# Patient Record
Sex: Male | Born: 1985 | Race: White | Hispanic: No | Marital: Single | Smoking: Light tobacco smoker
Health system: Southern US, Community
[De-identification: ages and names within clinical notes are randomized; demographics above are authoritative.]

## PROBLEM LIST (undated history)

## (undated) DIAGNOSIS — T7840XA Allergy, unspecified, initial encounter: Secondary | ICD-10-CM

## (undated) HISTORY — PX: WISDOM TOOTH EXTRACTION: SHX21

## (undated) HISTORY — DX: Allergy, unspecified, initial encounter: T78.40XA

---

## 2012-08-27 ENCOUNTER — Ambulatory Visit (INDEPENDENT_AMBULATORY_CARE_PROVIDER_SITE_OTHER): Payer: BC Managed Care – PPO | Admitting: Family Medicine

## 2012-08-27 VITALS — BP 116/69 | HR 68 | Temp 98.0°F | Resp 16 | Ht 72.5 in | Wt 206.0 lb

## 2012-08-27 DIAGNOSIS — F429 Obsessive-compulsive disorder, unspecified: Secondary | ICD-10-CM

## 2012-08-27 DIAGNOSIS — R4681 Obsessive-compulsive behavior: Secondary | ICD-10-CM

## 2012-08-27 DIAGNOSIS — F411 Generalized anxiety disorder: Secondary | ICD-10-CM

## 2012-08-27 DIAGNOSIS — F419 Anxiety disorder, unspecified: Secondary | ICD-10-CM

## 2012-08-27 LAB — COMPREHENSIVE METABOLIC PANEL
Albumin: 4.5 g/dL (ref 3.5–5.2)
CO2: 28 mEq/L (ref 19–32)
Calcium: 9.4 mg/dL (ref 8.4–10.5)
Chloride: 104 mEq/L (ref 96–112)
Glucose, Bld: 90 mg/dL (ref 70–99)
Potassium: 5.1 mEq/L (ref 3.5–5.3)
Sodium: 138 mEq/L (ref 135–145)
Total Protein: 7 g/dL (ref 6.0–8.3)

## 2012-08-27 LAB — POCT CBC
Granulocyte percent: 75.9 %G (ref 37–80)
HCT, POC: 51.8 % (ref 43.5–53.7)
Hemoglobin: 16.9 g/dL (ref 14.1–18.1)
Lymph, poc: 1.6 (ref 0.6–3.4)
MCHC: 32.6 g/dL (ref 31.8–35.4)
POC Granulocyte: 6.8 (ref 2–6.9)
RBC: 5.29 M/uL (ref 4.69–6.13)

## 2012-08-27 LAB — TSH: TSH: 0.715 u[IU]/mL (ref 0.350–4.500)

## 2012-08-27 MED ORDER — ESCITALOPRAM OXALATE 10 MG PO TABS: 10.0000 mg | ORAL_TABLET | Freq: Every day | ORAL | Status: DC

## 2012-08-27 NOTE — Patient Instructions (Addendum)
1. Anxiety  POCT CBC, Comprehensive metabolic panel, TSH  2. Obsessive behavior

## 2012-08-27 NOTE — Progress Notes (Signed)
[address]   [place], [place]  [address]   [phone]  Subjective:    Patient ID: Zachary Beard, male    DOB: August 24, 1985, 27 y.o.   MRN: [id number]  HPIThis 27 y.o. male presents for evaluation of stress.  When gets stressed, will go to the gym or drink.  Does not drink daily; drinks twice weekly during slow work season.  With busy work schedule, will drink once very two weeks.  Always goes to the gym; increased frequency with stress. Previously weighed 300 pounds; very determined to manage weight.    If does something, does it in excess and takes too far.  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 Also excess worrier since childhood.    Dating for five years; finace perceives that excessive personality traits/habits are worsening.[facility] house but does not have to be perfect.  If starts to clean, must be perfect.  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  Has been exercising consistently for two months at six days per week.  Current stressors include applying to grad school.  +Excessive worrying chronic.  Create things to worry about when things are going well.  Fiance sees Merla Riches; fiance suffers with seasonal affective disorder; greatly improved with medication; thus pt sees the benefit of trial of medication.  No SI/HI.  Works at [employer]; Arts development officer.  Applying to graduate school.  No SI/HI.  No alcohol use daily; no drug use in years; no previous addiction to drugs; would like to drink daily but realizes this is not good for him so will not keep alcohol in the house.  Smokes socially; dips socially.   Has never undergone counseling or behavior therapy.  +chronic problem with difficulty falling asleep.  Review of Systems  Constitutional: Negative for fever, chills, diaphoresis, appetite change and fatigue.  Psychiatric/Behavioral: Positive for sleep  disturbance. Negative for suicidal ideas, behavioral problems, confusion, self-injury, dysphoric mood, decreased concentration and agitation. The patient is nervous/anxious. The patient is not hyperactive.         Past Medical History  Diagnosis Date  . Allergy     History reviewed. No pertinent past surgical history.  Prior to Admission medications   Medication Sig Start Date End Date Taking? Authorizing Provider  escitalopram (LEXAPRO) 10 MG tablet Take 1 tablet (10 mg total) by mouth daily. 08/27/12   Ethelda Chick, MD  Omega-3 Fatty Acids (FISH OIL PO) Take by mouth.   Yes Historical Provider, MD    No Known Allergies  History   Social History  . Marital Status: Single    Spouse Name: N/A    Number of Children: N/A  . Years of Education: N/A   Occupational History  . Not on file.   Social History Main Topics  . Smoking status: Never Smoker   . Smokeless tobacco: Not on file  . Alcohol Use: Yes  . Drug Use: No  . Sexually Active: Yes   Other Topics Concern  . Not on file   Social History Narrative   Marital status: single; engaged.  Together x 5 years.   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    Family History  Problem Relation Age of Onset  . Cancer Mother   . Hepatitis C Mother   . Hypertension Mother     Objective:   Physical Exam  Nursing note and vitals reviewed. Constitutional: He is oriented to person, place, and time. He appears well-developed and well-nourished. No distress.  HENT:  Head: Normocephalic and atraumatic.  Eyes: Conjunctivae normal are normal. Pupils are equal, round, and reactive to light.  Neck: Normal range of motion. Neck supple. No thyromegaly present.    Cardiovascular: Normal rate, regular rhythm and normal heart sounds.   No murmur heard. Pulmonary/Chest: Effort normal and breath sounds normal. He has no wheezes. He has no rales.  Lymphadenopathy:    He has no cervical adenopathy.  Neurological: He is alert and oriented to person, place, and time. He has normal reflexes. No cranial nerve deficit. He exhibits normal muscle tone. Coordination normal.  Skin: He is not diaphoretic.  Psychiatric: He has a normal mood and affect. His behavior is normal. Judgment and thought content normal.        Assessment & Plan:   1. Anxiety  POCT CBC, Comprehensive metabolic panel, TSH  2. Obsessive behavior       1. Anxiety:  New.  Treat with Lexapro 10mg  daily; also recommend counseling; pt to contact [employer] to see if services available for employees; if not, will refer for psychotherapy at next visit.  Obtain TSH.  Recommend follow-up in 4-6 weeks. 2.  Obsessive behavior:  New.  Worsening with increased stressors. Treat underlying anxiety and reassess.  Also recommend psychotherapy.  Meds ordered this encounter  Medications  . Omega-3 Fatty Acids (FISH OIL PO)    Sig: Take by mouth.  . escitalopram (LEXAPRO) 10 MG tablet    Sig: Take 1 tablet (10 mg total) by mouth daily.    Dispense:  30 tablet    Refill:  5

## 2012-08-28 NOTE — Progress Notes (Signed)
Left msg for pt to call to schedule 5 week f/up with Dr. Katrinka Blazing, Lennon Alstrom

## 2012-09-01 NOTE — Progress Notes (Signed)
No response from pt, sent reminder letter to schedule 4-6 week f-up with Dr. Katrinka Blazing.

## 2012-11-03 ENCOUNTER — Encounter (HOSPITAL_COMMUNITY): Payer: Self-pay | Admitting: Emergency Medicine

## 2012-11-03 ENCOUNTER — Emergency Department (HOSPITAL_COMMUNITY)
Admission: EM | Admit: 2012-11-03 | Discharge: 2012-11-03 | Disposition: A | Discharge status: Home / Self Care | Payer: BC Managed Care – PPO | Attending: Emergency Medicine | Admitting: Emergency Medicine

## 2012-11-03 ENCOUNTER — Emergency Department (HOSPITAL_COMMUNITY): Payer: BC Managed Care – PPO

## 2012-11-03 DIAGNOSIS — S02402A Zygomatic fracture, unspecified, initial encounter for closed fracture: Secondary | ICD-10-CM

## 2012-11-03 DIAGNOSIS — S0003XA Contusion of scalp, initial encounter: Secondary | ICD-10-CM | POA: Insufficient documentation

## 2012-11-03 DIAGNOSIS — S01501A Unspecified open wound of lip, initial encounter: Secondary | ICD-10-CM | POA: Insufficient documentation

## 2012-11-03 DIAGNOSIS — S0990XA Unspecified injury of head, initial encounter: Secondary | ICD-10-CM | POA: Insufficient documentation

## 2012-11-03 DIAGNOSIS — Z23 Encounter for immunization: Secondary | ICD-10-CM | POA: Insufficient documentation

## 2012-11-03 DIAGNOSIS — Z79899 Other long term (current) drug therapy: Secondary | ICD-10-CM | POA: Insufficient documentation

## 2012-11-03 DIAGNOSIS — S0180XA Unspecified open wound of other part of head, initial encounter: Secondary | ICD-10-CM | POA: Insufficient documentation

## 2012-11-03 DIAGNOSIS — S00431A Contusion of right ear, initial encounter: Secondary | ICD-10-CM

## 2012-11-03 DIAGNOSIS — S0083XA Contusion of other part of head, initial encounter: Secondary | ICD-10-CM

## 2012-11-03 DIAGNOSIS — T07XXXA Unspecified multiple injuries, initial encounter: Secondary | ICD-10-CM

## 2012-11-03 DIAGNOSIS — S02400A Malar fracture unspecified, initial encounter for closed fracture: Secondary | ICD-10-CM | POA: Insufficient documentation

## 2012-11-03 DIAGNOSIS — S02401A Maxillary fracture, unspecified, initial encounter for closed fracture: Secondary | ICD-10-CM | POA: Insufficient documentation

## 2012-11-03 DIAGNOSIS — F1010 Alcohol abuse, uncomplicated: Secondary | ICD-10-CM | POA: Insufficient documentation

## 2012-11-03 DIAGNOSIS — F10929 Alcohol use, unspecified with intoxication, unspecified: Secondary | ICD-10-CM

## 2012-11-03 DIAGNOSIS — F17200 Nicotine dependence, unspecified, uncomplicated: Secondary | ICD-10-CM | POA: Insufficient documentation

## 2012-11-03 LAB — POCT I-STAT, CHEM 8
Calcium, Ion: 1.06 mmol/L — ABNORMAL LOW (ref 1.12–1.23)
Chloride: 106 mEq/L (ref 96–112)
Glucose, Bld: 95 mg/dL (ref 70–99)
HCT: 48 % (ref 39.0–52.0)
TCO2: 27 mmol/L (ref 0–100)

## 2012-11-03 LAB — CBC
MCH: 33.5 pg (ref 26.0–34.0)
MCHC: 35.5 g/dL (ref 30.0–36.0)
MCV: 94.5 fL (ref 78.0–100.0)
Platelets: 172 10*3/uL (ref 150–400)
RBC: 4.89 MIL/uL (ref 4.22–5.81)
RDW: 13.1 % (ref 11.5–15.5)

## 2012-11-03 LAB — ETHANOL: Alcohol, Ethyl (B): 188 mg/dL — ABNORMAL HIGH (ref 0–11)

## 2012-11-03 MED ORDER — HYDROCODONE-ACETAMINOPHEN 5-325 MG PO TABS: 1.0000 | ORAL_TABLET | Freq: Four times a day (QID) | ORAL | Status: DC | PRN

## 2012-11-03 MED ORDER — CEPHALEXIN 500 MG PO CAPS: 500.0000 mg | ORAL_CAPSULE | Freq: Two times a day (BID) | ORAL | Status: DC

## 2012-11-03 MED ORDER — TETANUS-DIPHTH-ACELL PERTUSSIS 5-2.5-18.5 LF-MCG/0.5 IM SUSP
0.5000 mL | Freq: Once | INTRAMUSCULAR | Status: AC
  Administered 2012-11-03: 0.5 mL via INTRAMUSCULAR
  Filled 2012-11-03: qty 0.5

## 2012-11-03 NOTE — ED Notes (Signed)
Although pt GCS is 13, he is able to properly identify the name of friends here to see him in the waiting room.  Pt agreed to have them come see him.  Will allow them to see him briefly until he is able to become more coherent.
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
## 2012-11-04 NOTE — ED Provider Notes (Signed)
Medical screening examination/treatment/procedure(s) were performed by non-physician practitioner and as supervising physician I was immediately available for consultation/collaboration.   Julie Manly, MD 11/04/12 0822 

## 2018-03-14 ENCOUNTER — Emergency Department (HOSPITAL_COMMUNITY): Payer: No Typology Code available for payment source

## 2018-03-14 ENCOUNTER — Encounter (HOSPITAL_COMMUNITY): Payer: Self-pay | Admitting: Emergency Medicine

## 2018-03-14 ENCOUNTER — Emergency Department (HOSPITAL_COMMUNITY)
Admission: EM | Admit: 2018-03-14 | Discharge: 2018-03-14 | Disposition: A | Discharge status: Home / Self Care | Payer: No Typology Code available for payment source | Attending: Emergency Medicine | Admitting: Emergency Medicine

## 2018-03-14 ENCOUNTER — Other Ambulatory Visit: Payer: Self-pay

## 2018-03-14 DIAGNOSIS — R0781 Pleurodynia: Secondary | ICD-10-CM | POA: Diagnosis not present

## 2018-03-14 DIAGNOSIS — F17200 Nicotine dependence, unspecified, uncomplicated: Secondary | ICD-10-CM | POA: Diagnosis not present

## 2018-03-14 DIAGNOSIS — Z79899 Other long term (current) drug therapy: Secondary | ICD-10-CM | POA: Diagnosis not present

## 2018-03-14 LAB — I-STAT CHEM 8, ED
BUN: 19 mg/dL (ref 6–20)
CHLORIDE: 102 mmol/L (ref 98–111)
Calcium, Ion: 1.07 mmol/L — ABNORMAL LOW (ref 1.15–1.40)
Creatinine, Ser: 0.9 mg/dL (ref 0.61–1.24)
Glucose, Bld: 111 mg/dL — ABNORMAL HIGH (ref 70–99)
HEMATOCRIT: 49 % (ref 39.0–52.0)
Hemoglobin: 16.7 g/dL (ref 13.0–17.0)
Potassium: 4.9 mmol/L (ref 3.5–5.1)
SODIUM: 137 mmol/L (ref 135–145)
TCO2: 28 mmol/L (ref 22–32)

## 2018-03-14 LAB — I-STAT TROPONIN, ED: Troponin i, poc: 0 ng/mL (ref 0.00–0.08)

## 2018-03-14 MED ORDER — KETOROLAC TROMETHAMINE 15 MG/ML IJ SOLN
15.0000 mg | Freq: Once | INTRAMUSCULAR | Status: AC
  Administered 2018-03-14: 15 mg via INTRAVENOUS
  Filled 2018-03-14: qty 1

## 2018-03-14 MED ORDER — LIDOCAINE 5 % EX PTCH
1.0000 | MEDICATED_PATCH | CUTANEOUS | Status: DC
  Administered 2018-03-14: 1 via TRANSDERMAL
  Filled 2018-03-14: qty 1

## 2018-03-14 MED ORDER — LIDOCAINE 5 % EX PTCH: 1.0000 | MEDICATED_PATCH | CUTANEOUS | 0 refills | Status: DC

## 2018-03-14 MED ORDER — CYCLOBENZAPRINE HCL 10 MG PO TABS: 10.0000 mg | ORAL_TABLET | Freq: Two times a day (BID) | ORAL | 0 refills | Status: DC | PRN

## 2018-03-14 NOTE — ED Provider Notes (Signed)
[facility] EMERGENCY DEPARTMENT Provider Note   CSN: [id number] Arrival date & time: 03/14/18  0112     History   Chief Complaint Chief Complaint  Patient presents with  . Chest Pain    HPI Zachary Beard is a 32 y.o. male.  HPI 32 year old Caucasian male presents to the ED for evaluation of left-sided rib cage pain.  Patient reports 10 years ago he "dislocated his ribs".  Patient states he did this while warrant running.  He was seen by the orthopedic doctor.  He states that he will get these episodes where he gets intense pain in the left side of his ribs when he is working out.  He states that they typically resolve by anti-inflammatories and rest.  States that this evening he had the pain and awoke him from sleep.  He took ibuprofen without any relief.  Patient states pain is worse with palpation and range of motion.  Denies any associated shortness of breath or chest pain.  Patient denies any history of DVT/PE, prolonged immobilization or recent hospitalization/surgeries, unilateral leg swelling or calf tenderness, hemoptysis or tobacco use.  Denies any cardiac history.  Patient denies any recent injuries to his chest.  Denies any nausea or vomiting.  Pt denies any fever, chill, ha, vision changes, lightheadedness, dizziness, congestion, neck pain, cp, sob, cough, abd pain, n/v/d, urinary symptoms, change in bowel habits, melena, hematochezia, lower extremity paresthesias.  Past Medical History:  Diagnosis Date  . Allergy     There are no active problems to display for this patient.   Past Surgical History:  Procedure Laterality Date  . WISDOM TOOTH EXTRACTION          Home Medications    Prior to Admission medications   Medication Sig Start Date End Date Taking? Authorizing Provider  cephALEXin (KEFLEX) 500 MG capsule Take 1 capsule (500 mg total) by mouth 2 (two) times daily. 11/03/12   Kirichenko, Lemont Fillers, PA-C  cyclobenzaprine (FLEXERIL) 10 MG  tablet Take 1 tablet (10 mg total) by mouth 2 (two) times daily as needed for muscle spasms. 03/14/18   Rise Mu, PA-C  escitalopram (LEXAPRO) 10 MG tablet Take 1 tablet (10 mg total) by mouth daily. 08/27/12   Ethelda Chick, MD  HYDROcodone-acetaminophen (NORCO) 5-325 MG per tablet Take 1 tablet by mouth every 6 (six) hours as needed for pain. 11/03/12   Kirichenko, Tatyana, PA-C  lidocaine (LIDODERM) 5 % Place 1 patch onto the skin daily. Remove & Discard patch within 12 hours or as directed by MD 03/14/18   Demetrios Loll T, PA-C  Omega-3 Fatty Acids (FISH OIL PO) Take by mouth.    [provider]    Family History Family History  Problem Relation Age of Onset  . Cancer Mother   . Hepatitis C Mother   . Hypertension Mother     Social History Social History   Tobacco Use  . Smoking status: Light Tobacco Smoker  . Smokeless tobacco: Never Used  Substance Use Topics  . Alcohol use: Yes  . Drug use: No     Allergies   Patient has no known allergies.   Review of Systems Review of Systems  All other systems reviewed and are negative.    Physical Exam Updated Vital Signs BP 128/71 (BP Location: Right Arm)   Pulse 69   Temp 98.1 F (36.7 C) (Oral)   Resp 16   Ht 6' (1.829 m)   Wt 97.5 kg (215 lb)  SpO2 99%   BMI 29.16 kg/m   Physical Exam  Constitutional: He appears well-developed and well-nourished. No distress.  HENT:  Head: Normocephalic and atraumatic.  Eyes: Right eye exhibits no discharge. Left eye exhibits no discharge. No scleral icterus.  Neck: Normal range of motion.  Cardiovascular: Normal rate, regular rhythm, normal heart sounds and intact distal pulses. Exam reveals no gallop and no friction rub.  No murmur heard. Pulmonary/Chest: Effort normal. No respiratory distress. He has no decreased breath sounds. He has no wheezes. He has no rales.      Tender to palpation.  No obvious ecchymosis, crepitus, step-offs.  Abdominal:  Soft. Bowel sounds are normal.  Musculoskeletal: Normal range of motion.  No lower extremity edema. No calf tenderness.   Neurological: He is alert.  Skin: Skin is warm and dry. Capillary refill takes less than 2 seconds. No pallor.  Psychiatric: His behavior is normal. Judgment and thought content normal.  Nursing note and vitals reviewed.    ED Treatments / Results  Labs (all labs ordered are listed, but only abnormal results are displayed) Labs Reviewed  I-STAT CHEM 8, ED - Abnormal; Notable for the following components:      Result Value   Glucose, Bld 111 (*)    Calcium, Ion 1.07 (*)    All other components within normal limits  I-STAT TROPONIN, ED    EKG EKG Interpretation  Date/Time:  Tuesday March 14 2018 05:17:31 EDT Ventricular Rate:  77 PR Interval:  102 QRS Duration: 100 QT Interval:  386 QTC Calculation: 436 R Axis:   91 Text Interpretation:  Sinus rhythm with short PR Rightward axis Borderline ECG Confirmed by Geoffery Lyons ([id number]) on 03/14/2018 5:25:48 AM Also confirmed by Geoffery Lyons ([id number]), editor Barbette Hair [phone])  on 03/14/2018 7:38:51 AM   Radiology Dg Chest 2 View  Result Date: 03/14/2018 CLINICAL DATA:  Rib injury left-sided rib pain EXAM: CHEST - 2 VIEW COMPARISON:  11/03/2012 FINDINGS: No acute opacity or pleural effusion. Normal heart size. No pneumothorax. Right-sided nipple ring. No acute osseous abnormality. IMPRESSION: No active cardiopulmonary disease. Electronically Signed   By: Jasmine Pang M.D.   On: 03/14/2018 02:09    Procedures Procedures (including critical care time)  Medications Ordered in ED Medications  ketorolac (TORADOL) 15 MG/ML injection 15 mg (15 mg Intravenous Given 03/14/18 0550)     Initial Impression / Assessment and Plan / ED Course  I have reviewed the triage vital signs and the nursing notes.  Pertinent labs & imaging results that were available during my care of the patient were reviewed by me and  considered in my medical decision making (see chart for details).     Patient reports to the ED for intermittent left rib cage pain after dislocating her rib 10 years ago.  Patient states the pain was not controlled by ibuprofen at home this evening.  He is PERC negative.  Denies cardiac history.  Work-up reassuring.  EKG shows no signs of acute ischemia.  Troponin was negative.  Chest x-ray shows no acute findings.  Pain is reproducible on palpation.  Improved with Toradol and lidocaine patch.  Suspect musculoskeletal in nature.  Clinical presentation not consistent with ACS, dissection or PE.  Patient will be treated with anti-inflammatories, muscle relaxers and lidocaine patch.  Encouraged follow-up with PCP and return precautions.  Pt is hemodynamically stable, in NAD, & able to ambulate in the ED. Evaluation does not show pathology that would require ongoing emergent intervention or  inpatient treatment. I explained the diagnosis to the patient. Pain has been managed & has no complaints prior to dc. Pt is comfortable with above plan and is stable for discharge at this time. All questions were answered prior to disposition. Strict return precautions for f/u to the ED were discussed. Encouraged follow up with PCP.   Final Clinical Impressions(s) / ED Diagnoses   Final diagnoses:  Rib pain    ED Discharge Orders        Ordered    lidocaine (LIDODERM) 5 %  Every 24 hours     03/14/18 0541    cyclobenzaprine (FLEXERIL) 10 MG tablet  2 times daily PRN     03/14/18 0541       Rise MuLeaphart,  T, PA-C 03/14/18 2155    Geoffery Lyonselo, Douglas, MD 03/15/18 [phone]

## 2018-03-14 NOTE — Discharge Instructions (Signed)
Your work-up has been reassuring.  This may be musculoskeletal in nature.  Continue taking NSAIDs.Please the the flexeril for muscle relaxation. This medication will make you drowsy so avoid situation that could place you in danger.  Follow-up with your primary care doctor return the ED with any worsening symptoms.  May keep ice on the area to help with the pain.  Have given you lidocaine patches to help as well.

## 2018-03-14 NOTE — ED Triage Notes (Signed)
Pt states he has history 10 years ago of rib "dislocation" and resolved with rest and anti inflammatories.  Has happened again over that time but self resolves.    This morning awoke with rib pain on the left side.

## 2018-11-28 ENCOUNTER — Emergency Department (HOSPITAL_COMMUNITY): Payer: No Typology Code available for payment source

## 2018-11-28 ENCOUNTER — Other Ambulatory Visit: Payer: Self-pay

## 2018-11-28 ENCOUNTER — Emergency Department (HOSPITAL_COMMUNITY)
Admission: EM | Admit: 2018-11-28 | Discharge: 2018-11-28 | Disposition: A | Discharge status: Home / Self Care | Payer: No Typology Code available for payment source | Attending: Emergency Medicine | Admitting: Emergency Medicine

## 2018-11-28 ENCOUNTER — Encounter (HOSPITAL_COMMUNITY): Payer: Self-pay | Admitting: Emergency Medicine

## 2018-11-28 DIAGNOSIS — Y929 Unspecified place or not applicable: Secondary | ICD-10-CM | POA: Diagnosis not present

## 2018-11-28 DIAGNOSIS — F17210 Nicotine dependence, cigarettes, uncomplicated: Secondary | ICD-10-CM | POA: Insufficient documentation

## 2018-11-28 DIAGNOSIS — S51852A Open bite of left forearm, initial encounter: Secondary | ICD-10-CM | POA: Insufficient documentation

## 2018-11-28 DIAGNOSIS — Y939 Activity, unspecified: Secondary | ICD-10-CM | POA: Insufficient documentation

## 2018-11-28 DIAGNOSIS — S61451A Open bite of right hand, initial encounter: Secondary | ICD-10-CM | POA: Diagnosis not present

## 2018-11-28 DIAGNOSIS — Y999 Unspecified external cause status: Secondary | ICD-10-CM | POA: Insufficient documentation

## 2018-11-28 DIAGNOSIS — W540XXA Bitten by dog, initial encounter: Secondary | ICD-10-CM | POA: Diagnosis not present

## 2018-11-28 DIAGNOSIS — S51851A Open bite of right forearm, initial encounter: Secondary | ICD-10-CM | POA: Diagnosis not present

## 2018-11-28 DIAGNOSIS — S41151A Open bite of right upper arm, initial encounter: Secondary | ICD-10-CM

## 2018-11-28 DIAGNOSIS — S61452A Open bite of left hand, initial encounter: Secondary | ICD-10-CM | POA: Diagnosis present

## 2018-11-28 DIAGNOSIS — S41152A Open bite of left upper arm, initial encounter: Secondary | ICD-10-CM

## 2018-11-28 DIAGNOSIS — Z23 Encounter for immunization: Secondary | ICD-10-CM | POA: Insufficient documentation

## 2018-11-28 MED ORDER — KETOROLAC TROMETHAMINE 30 MG/ML IJ SOLN
30.0000 mg | Freq: Once | INTRAMUSCULAR | Status: AC
  Administered 2018-11-28: 06:00:00 30 mg via INTRAMUSCULAR
  Filled 2018-11-28: qty 1

## 2018-11-28 MED ORDER — TETANUS-DIPHTH-ACELL PERTUSSIS 5-2.5-18.5 LF-MCG/0.5 IM SUSP
0.5000 mL | Freq: Once | INTRAMUSCULAR | Status: AC
  Administered 2018-11-28: 05:00:00 0.5 mL via INTRAMUSCULAR
  Filled 2018-11-28: qty 0.5

## 2018-11-28 MED ORDER — AMOXICILLIN-POT CLAVULANATE 875-125 MG PO TABS: 1.0000 | ORAL_TABLET | Freq: Two times a day (BID) | ORAL | 0 refills | Status: DC

## 2018-11-28 MED ORDER — AMPICILLIN-SULBACTAM SODIUM 3 (2-1) G IJ SOLR
3.0000 g | Freq: Once | INTRAMUSCULAR | Status: AC
  Administered 2018-11-28: 05:00:00 3 g via INTRAVENOUS
  Filled 2018-11-28: qty 3

## 2018-11-28 MED ORDER — CLINDAMYCIN PHOSPHATE 600 MG/50ML IV SOLN
600.0000 mg | Freq: Once | INTRAVENOUS | Status: AC
  Administered 2018-11-28: 600 mg via INTRAVENOUS
  Filled 2018-11-28: qty 50

## 2018-11-28 MED ORDER — LIDOCAINE HCL 1 % IJ SOLN
20.0000 mL | Freq: Once | INTRAMUSCULAR | Status: DC
  Filled 2018-11-28: qty 20

## 2018-11-28 NOTE — Discharge Instructions (Addendum)
Thank you for allowing me to care for you today in the Emergency Department.   Call today to schedule a wound check with Dr. Izora Ribas in his office in 2 to 3 days.  Take 1 tablet of Augmentin 2 times daily for the next week.  You can take Tylenol and ibuprofen for pain control.  Please wear the brace on your left hand to avoid ripping out your stitches.   To care for your other wounds, clean the areas with warm water and soap at least once daily.  You can then apply a topical antibiotic such as bacitracin or Neosporin to the wounds.  For the wound on your right ring finger, after cleaning the wound, replace the yellow Xeroform dressing at least once daily and then reapply a gauze dressing and brown Coban as we instructed you in the ER.  Change this daily until you are seen by Dr. Izora Ribas.  Dr. Izora Ribas recommended that sometimes when these wounds scab over that they will have a little bit of clear/yellowish drainage under the scab. Allowing the wound to drain can help with pain.   You can apply an ice pack to help with bruising and swelling.  Your tetanus immunization was updated today.  Follow-up with animal control regarding the dog's rabies status since you did not receive the rabies vaccination series today.  Return to the emergency department if you develop a high fever, red streaking up the wounds, if the wounds get red, hot to the touch, have worsening thick, mucus-like drainage with a foul odor despite taking the antibiotics, or other new, concerning symptoms.

## 2018-11-28 NOTE — ED Provider Notes (Signed)
[facility] EMERGENCY DEPARTMENT Provider Note   CSN: [id number] Arrival date & time: 11/28/18  0324    History   Chief Complaint Chief Complaint  Patient presents with   Animal Bite    HPI Zachary Beard is a 33 y.o. male with no pertinent past medical history who presents to the emergency department with a chief complaint of dog bite.  The patient reports he was bitten multiple times on the bilateral hands and arms by a dog that he was unfamiliar with.  He reports significant pain to the aspect of the left hand and the right ring finger.  Pain is worse with movement of these areas.  He reports that the wounds continue to bleed until they were dressed in triage.  He is right-hand dominant.  He denies numbness or weakness or pain to the bilateral wrists or elbows.  He is unsure when his tetanus was last updated.  He reports that he is already spoken with animal control who has obtained the dog.  He reports that he is previously undergone the rabies immunization series several years ago as he has worked with foster dogs in the past.  He also reports drinking 1-2 beers hourly for at least the last 12 hours.  He also reports that he smoked marijuana.  He treated his wounds by pouring peroxide on them prior to arrival.  No other treatment prior to arrival.     The history is provided by the patient. No language interpreter was used.    Past Medical History:  Diagnosis Date   Allergy     There are no active problems to display for this patient.   Past Surgical History:  Procedure Laterality Date   WISDOM TOOTH EXTRACTION          Home Medications    Prior to Admission medications   Medication Sig Start Date End Date Taking? Authorizing Provider  amoxicillin-clavulanate (AUGMENTIN) 875-125 MG tablet Take 1 tablet by mouth every 12 (twelve) hours. 11/28/18   Jeri Jeanbaptiste, Coral Else, PA-C    Family History Family History  Problem Relation Age of Onset   Cancer  Mother    Hepatitis C Mother    Hypertension Mother     Social History Social History   Tobacco Use   Smoking status: Light Tobacco Smoker   Smokeless tobacco: Never Used  Substance Use Topics   Alcohol use: Yes   Drug use: No     Allergies   Patient has no known allergies.   Review of Systems Review of Systems  Constitutional: Negative for appetite change and fever.  Respiratory: Negative for shortness of breath.   Cardiovascular: Negative for chest pain.  Gastrointestinal: Negative for abdominal pain.  Genitourinary: Negative for dysuria.  Musculoskeletal: Positive for arthralgias, joint swelling and myalgias. Negative for back pain.  Skin: Positive for color change and wound. Negative for rash.  Allergic/Immunologic: Negative for immunocompromised state.  Neurological: Negative for weakness, numbness and headaches.  Psychiatric/Behavioral: Negative for confusion.     Physical Exam Updated Vital Signs BP 122/73 (BP Location: Right Arm)    Pulse 99    Resp 16    Ht 6' (1.829 m)    Wt 97.5 kg    SpO2 95%    BMI 29.16 kg/m   Physical Exam Vitals signs and nursing note reviewed.  Constitutional:      Appearance: He is well-developed.     Comments: Strong odor of EtOH and marijuana  HENT:  Head: Normocephalic.  Eyes:     Conjunctiva/sclera: Conjunctivae normal.     Comments: Eyes are glassy  Neck:     Musculoskeletal: Neck supple.  Cardiovascular:     Rate and Rhythm: Normal rate and regular rhythm.     Heart sounds: No murmur.  Pulmonary:     Effort: Pulmonary effort is normal.  Abdominal:     General: There is no distension.     Palpations: Abdomen is soft.  Skin:    General: Skin is warm and dry.     Comments:  Deep laceration noted to the ulnar aspect of the left hand.  A portion of the tendon appears intact at the base of the wound.  Bleeding is controlled with pressure.  There appear to be several dog hairs in and around the wound.  There is  a laceration located in the interdigital space between the fourth and fifth fingers on the left hand.  No active bleeding.  Wound appears more superficial and clean.  There is a laceration located on the right ring finger that extends circumferentially around the digit from the mid portion of the medial nailbed around the palmar aspect of the digit.  Nail remains intact.  There is significant edema noted to distal tip of the right fourth digit.  Wound is jagged.  There is much matted dog hair around the wound.  Multiple superficial abrasions and puncture wounds noted to the bilateral hands and forearms that are hemostatic.  Neurological:     Mental Status: He is alert.     Comments: Slurred speech  Psychiatric:        Behavior: Behavior normal.   Right fourth digit     Dorsal aspect of right forearm  Ventral aspect of right forearm  Right fourth digit  Right fourth digit  Left hand   Left hand   Ulnar aspect of left hand        ED Treatments / Results  Labs (all labs ordered are listed, but only abnormal results are displayed) Labs Reviewed - No data to display  EKG None  Radiology Dg Wrist Complete Right  Result Date: 11/28/2018 CLINICAL DATA:  Multiple dog bites EXAM: RIGHT WRIST - COMPLETE 3+ VIEW COMPARISON:  None. FINDINGS: Soft tissue swelling about the wrist, with gas and greatest swelling seen ventrally and radially. No opaque foreign body or fracture. IMPRESSION: Soft tissue injury without fracture or opaque foreign body. Electronically Signed   By: Marnee Spring M.D.   On: 11/28/2018 05:02   Dg Hand Complete Left  Result Date: 11/28/2018 CLINICAL DATA:  Dog bites to both hands EXAM: LEFT HAND - COMPLETE 3+ VIEW COMPARISON:  None. FINDINGS: Soft tissue gas at the level of the central MCP joints tracking proximally along the dorsal hand. No underlying fracture or opaque foreign body. IMPRESSION: Soft tissue injury without opaque foreign body or fracture.  Electronically Signed   By: Marnee Spring M.D.   On: 11/28/2018 05:02   Dg Hand Complete Right  Result Date: 11/28/2018 CLINICAL DATA:  Multiple dog bites EXAM: RIGHT HAND - COMPLETE 3+ VIEW COMPARISON:  None. FINDINGS: Mild soft tissue swelling at the level of the wrist. There may also be soft tissue injury to the distal ring finger. No fracture or opaque foreign body. IMPRESSION: No opaque foreign body or fracture. Electronically Signed   By: Marnee Spring M.D.   On: 11/28/2018 05:01    Procedures .Marland KitchenLaceration Repair Date/Time: 11/28/2018 9:07 AM Performed by: Frederik Pear  A, PA-C Authorized by: Barkley BoardsMcDonald, Colie Fugitt A, PA-C   Consent:    Consent obtained:  Verbal   Consent given by:  Patient   Risks discussed:  Infection, pain, poor cosmetic result, poor wound healing, nerve damage, need for additional repair, retained foreign body and tendon damage   Alternatives discussed:  No treatment and referral Anesthesia (see MAR for exact dosages):    Anesthesia method:  Local infiltration   Local anesthetic:  Lidocaine 1% w/o epi Laceration details:    Location:  Hand   Hand location: Ulnar aspect of the left hand.   Length (cm):  3.5 Repair type:    Repair type:  Intermediate Pre-procedure details:    Preparation:  Patient was prepped and draped in usual sterile fashion and imaging obtained to evaluate for foreign bodies Exploration:    Hemostasis achieved with:  Direct pressure   Wound exploration: wound explored through full range of motion and entire depth of wound probed and visualized     Wound extent: fascia violated and foreign bodies/material     Wound extent: no nerve damage noted, no tendon damage noted, no underlying fracture noted and no vascular damage noted     Foreign bodies/material:  Dog hair   Contaminated: yes   Treatment:    Area cleansed with:  Shur-Clens, Betadine and saline   Amount of cleaning:  Extensive   Visualized foreign bodies/material removed: yes   Skin  repair:    Repair method:  Sutures   Suture size:  4-0   Suture material:  Prolene   Suture technique:  Simple interrupted   Number of sutures:  4 Approximation:    Approximation:  Loose Post-procedure details:    Dressing:  Antibiotic ointment and sterile dressing   Patient tolerance of procedure:  Tolerated well, no immediate complications .Nerve Block Date/Time: 11/28/2018 9:14 AM Performed by: Barkley BoardsMcDonald, Averleigh Savary A, PA-C Authorized by: Barkley BoardsMcDonald, Erick Oxendine A, PA-C   Consent:    Consent obtained:  Verbal   Consent given by:  Patient   Risks discussed:  Swelling, unsuccessful block, pain, nerve damage, intravenous injection, infection and bleeding   Alternatives discussed:  No treatment Indications:    Indications:  Pain relief and procedural anesthesia Location:    Laterality:  Right Pre-procedure details:    Skin preparation:  Alcohol   Preparation: Patient was prepped and draped in usual sterile fashion   Skin anesthesia (see MAR for exact dosages):    Skin anesthesia method:  Local infiltration   Local anesthetic:  Lidocaine 1% w/o epi Procedure details (see MAR for exact dosages):    Block needle gauge:  27 G   Anesthetic injected:  Lidocaine 1% w/o epi   Injection procedure:  Anatomic landmarks identified, incremental injection, negative aspiration for blood, introduced needle and anatomic landmarks palpated Post-procedure details:    Dressing:  Sterile dressing   Outcome:  Anesthesia achieved   Patient tolerance of procedure:  Tolerated well, no immediate complications   (including critical care time)  Medications Ordered in ED Medications  lidocaine (XYLOCAINE) 1 % (with pres) injection 20 mL (has no administration in time range)  Tdap (BOOSTRIX) injection 0.5 mL (0.5 mLs Intramuscular Given 11/28/18 0435)  Ampicillin-Sulbactam (UNASYN) 3 g in sodium chloride 0.9 % 100 mL IVPB (0 g Intravenous Stopped 11/28/18 0509)  clindamycin (CLEOCIN) IVPB 600 mg (0 mg Intravenous Stopped  11/28/18 0752)  ketorolac (TORADOL) 30 MG/ML injection 30 mg (30 mg Intramuscular Given 11/28/18 0619)     Initial Impression /  Assessment and Plan / ED Course  I have reviewed the triage vital signs and the nursing notes.  Pertinent labs & imaging results that were available during my care of the patient were reviewed by me and considered in my medical decision making (see chart for details).        33 year old male with no pertinent past medical history who is right-hand dominant presents with multiple wounds after being bitten by an unknown dog earlier tonight.  On arrival, the patient appears intoxicated.  He reports having drank many beers over the last 12+ hours.  The patient has multiple puncture wounds and superficial abrasions noted to the bilateral hands and forearms.  Specifically, he has a large deep wound to the ulnar aspect of the left hand and a circumferential wound noted to the right ring finger.  Both wounds have subcutaneous exposed tissue.  Tdap updated.  Patient declined pain control at this time.  X-rays of the bilateral hands are negative for underlying fracture.  Consulted hand surgery and spoke with Dr. Izora Ribas regarding the extent of the patient's wounds.  He recommended loose closure in the ER and 1 dose of I V antibiotics.  The patient had already received 1 dose of Unasyn and clindamycin ordered by me.  Recommended follow-up in his office.  The wound on the ulnar aspect of the left hand was copiously irrigated and extensively cleaned.  The wound was loosely closed with 4 simple interrupted sutures.  Attempted splint placement of the left hand, but premade splint appear to rub on the wound.  I did not want to have a fiberglass splint placed because I was concerned that he would not notice signs of infection to the other puncture wounds and superficial abrasions on the hands and forearms.  I recheck to the patient's ability to move the fingers and wrist of the left hand and his  stitches appear to be intact with low risk of ripping out.  Digital block was performed to the ring finger of the right hand.  Closure with sutures was attempted, but given the circumferential nature of the wound and significant edema of the fingertip, sutures placed in this wound would not hold.  After multiple attempts, the case was discussed with Dr. Clayborne Dana, attending physician.  He recommended covering the wound with Xeroform and a gauze dressing and Coban until the patient can follow up in Dr. Debby Bud office. Dressing place.  The patient requested pain medicine and was given Toradol.  The remainder of the puncture wounds and abrasions were copiously irrigated and cleaned with a Betadine and sterile saline solution.  Recommended Tylenol, ibuprofen, and ice for pain control at home.  The patient will follow up with animal control regarding the rabies status of the dog that is currently under quarantine.  The patient is also now clinically sober.  Strict return precautions given.  The patient is hemodynamically stable and in no acute distress.  Safe for discharge home with outpatient follow-up this time.  Final Clinical Impressions(s) / ED Diagnoses   Final diagnoses:  Dog bite, hand, left, initial encounter  Dog bite of right hand, initial encounter  Dog bite of arm, left, initial encounter  Dog bite of right upper extremity, initial encounter    ED Discharge Orders         Ordered    amoxicillin-clavulanate (AUGMENTIN) 875-125 MG tablet  Every 12 hours     11/28/18 0715           Javanni Maring, Pedro Earls  A, PA-C 11/28/18 0919    Mesner, Barbara Cower, MD 11/28/18 1610

## 2018-11-28 NOTE — ED Notes (Signed)
ETOH on board 

## 2018-11-28 NOTE — ED Triage Notes (Signed)
Pt coming in after being bit by an unknown dog in multiple places after calling it to him. Pt bleeding from bilateral forearms. Last tetanus unknown

## 2019-07-25 IMAGING — CR RIGHT WRIST - COMPLETE 3+ VIEW
4 series · 4 of 4 positions shown · non-contrast
Comparison: None.

CLINICAL DATA: Multiple dog bites

EXAM:
RIGHT WRIST - COMPLETE 3+ VIEW

[wrist pa]
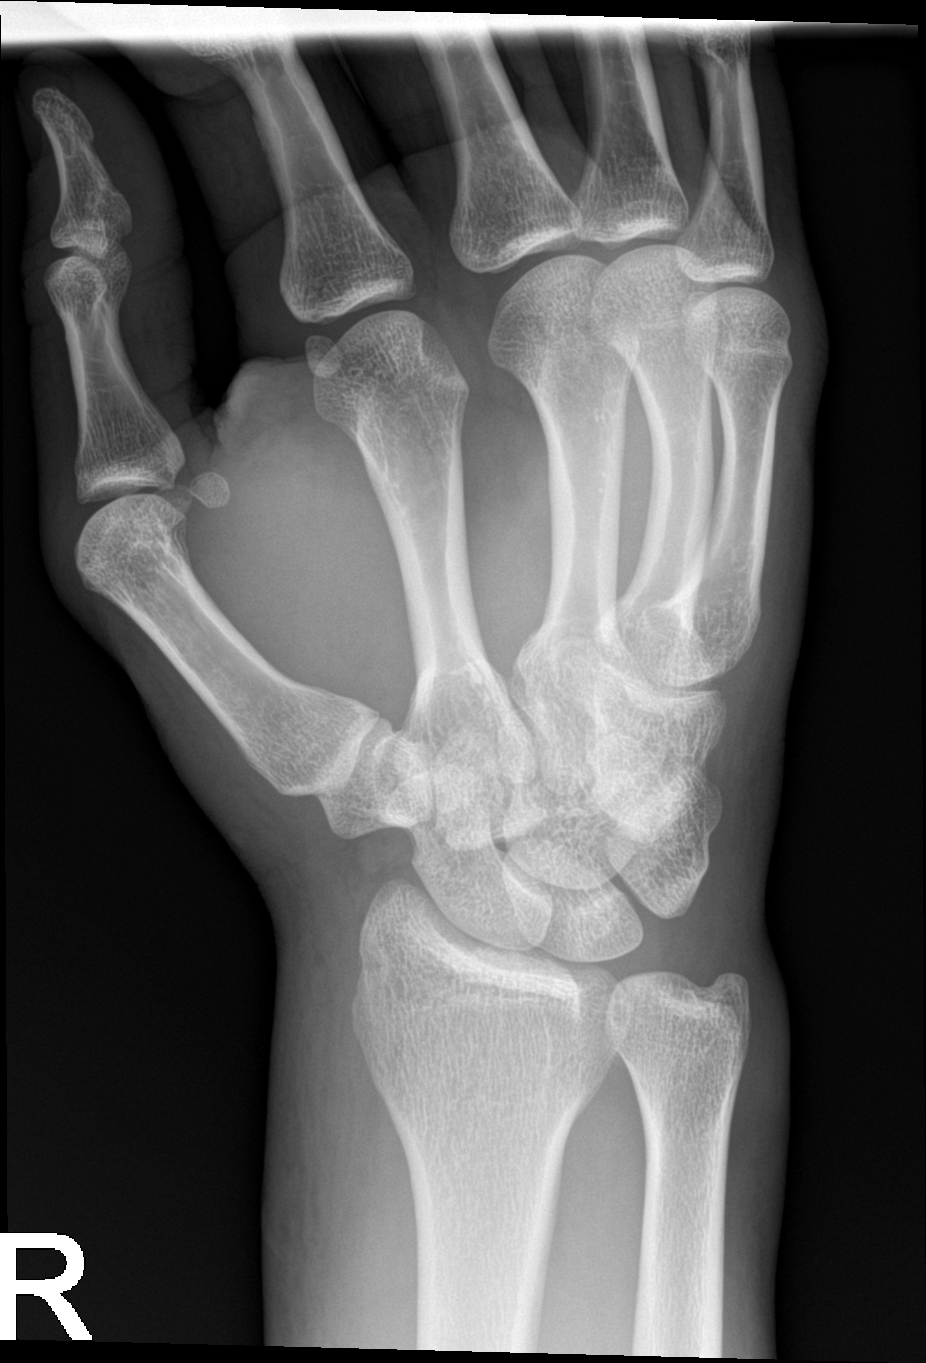

[wrist obl]
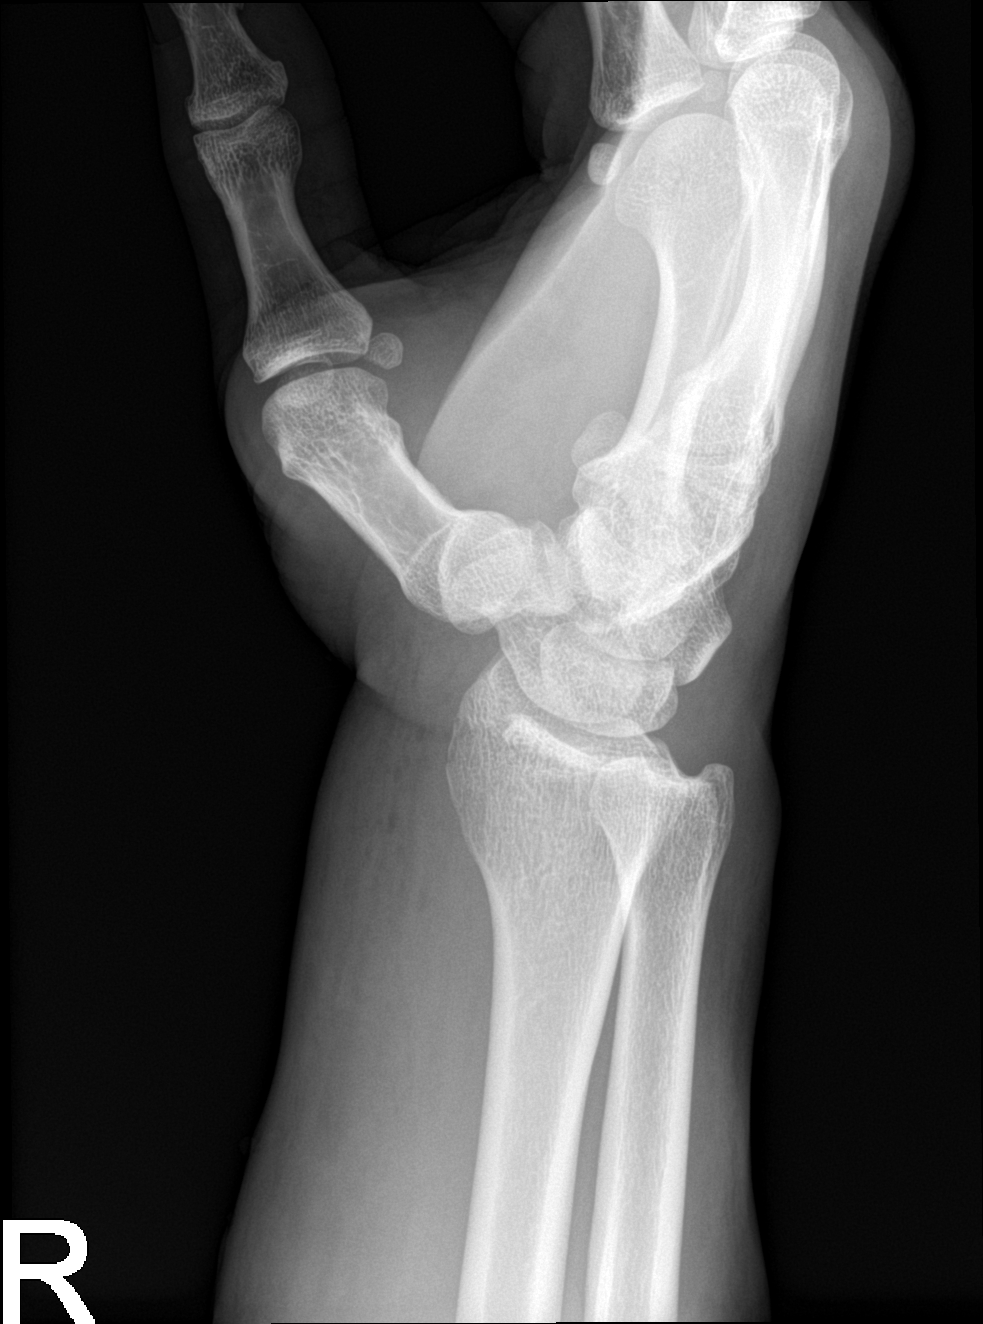

[wrist lat]
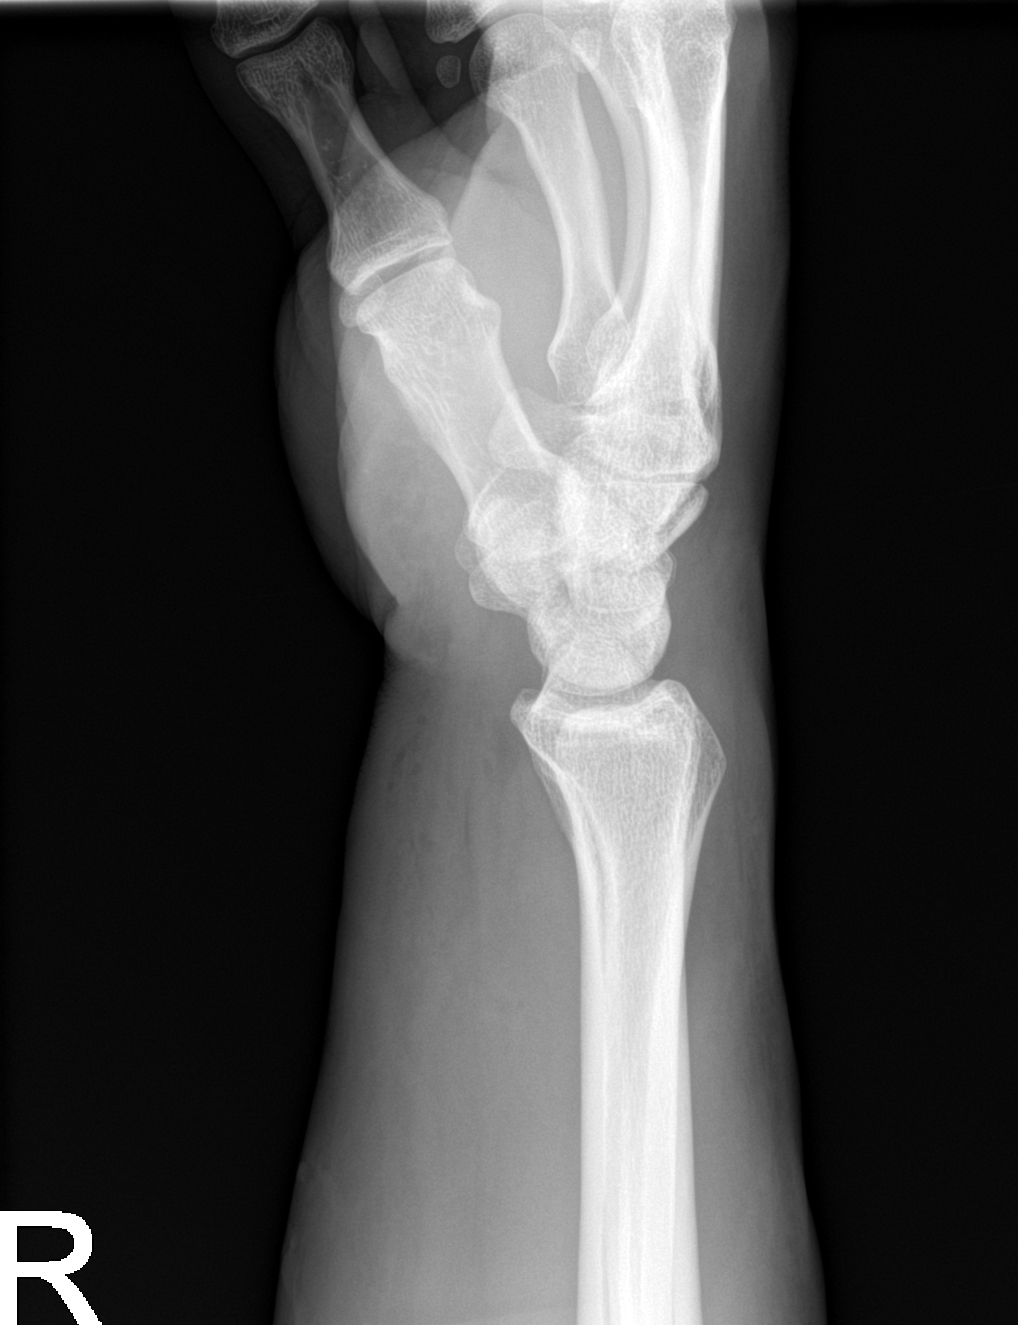

[wrist navicular]
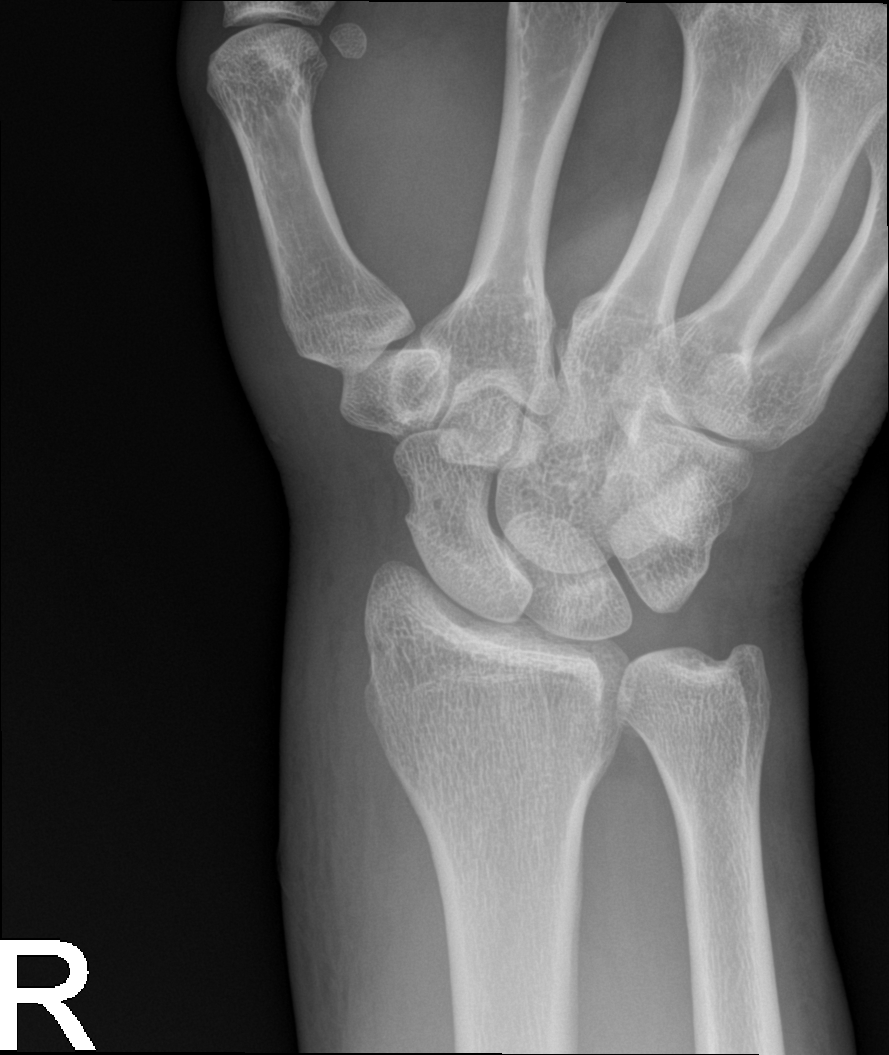

[4 of 4 positions shown; findings below may reference images not displayed]

FINDINGS: Soft tissue swelling about the wrist, with gas and greatest swelling
seen ventrally and radially. No opaque foreign body or fracture.
IMPRESSION: Soft tissue injury without fracture or opaque foreign body.

## 2020-10-15 ENCOUNTER — Ambulatory Visit
Admission: RE | Admit: 2020-10-15 | Discharge: 2020-10-15 | Disposition: A | Discharge status: Home / Self Care | Payer: PRIVATE HEALTH INSURANCE | Source: Ambulatory Visit | Attending: Emergency Medicine | Admitting: Emergency Medicine

## 2020-10-15 ENCOUNTER — Other Ambulatory Visit: Payer: Self-pay

## 2020-10-15 VITALS — BP 130/83 | HR 94 | Temp 98.9°F | Resp 18

## 2020-10-15 DIAGNOSIS — R531 Weakness: Secondary | ICD-10-CM | POA: Diagnosis not present

## 2020-10-15 DIAGNOSIS — R202 Paresthesia of skin: Secondary | ICD-10-CM

## 2020-10-15 DIAGNOSIS — R5383 Other fatigue: Secondary | ICD-10-CM | POA: Diagnosis not present

## 2020-10-15 MED ORDER — TIZANIDINE HCL 4 MG PO TABS: 2.0000 mg | ORAL_TABLET | Freq: Four times a day (QID) | ORAL | 0 refills | Status: AC | PRN

## 2020-10-15 MED ORDER — HYDROXYZINE HCL 25 MG PO TABS: 25.0000 mg | ORAL_TABLET | Freq: Four times a day (QID) | ORAL | 0 refills | Status: AC | PRN

## 2020-10-15 NOTE — ED Provider Notes (Signed)
[name]    CSN: [id number] Arrival date & time: 10/15/20  1358      History   Chief Complaint Chief Complaint  Patient presents with  . Numbness    HPI Zachary Beard is a 35 y.o. male presenting today for evaluation of numbness and tingling to his feet as well as associated fatigue. Reports paternal and maternal history of MS. Reports Reports feet, face and fingers with numbness  and tingling. Reports generalized weakness. Reports poor sleep. Reports cloudiness and slowed processing at time as well as stumbling over words. Reports some vision changes- reports blurriness, off and on as well as poorer vision. Symptoms off and on for past 15 years, worse in the past 2 weeks. Muscle tightness.  Does report personal stressors recently at home with recent miscarriage with his wife.  Reports increase in anxiety as well.  Has plans to follow-up in March with a psychiatrist.  HPI  Past Medical History:  Diagnosis Date  . Allergy     There are no problems to display for this patient.   Past Surgical History:  Procedure Laterality Date  . WISDOM TOOTH EXTRACTION         Home Medications    Prior to Admission medications   Medication Sig Start Date End Date Taking? Authorizing Provider  hydrOXYzine (ATARAX/VISTARIL) 25 MG tablet Take 1 tablet (25 mg total) by mouth every 6 (six) hours as needed for anxiety (insomnia). 10/15/20  Yes Sherlin Sonier C, PA-C  tiZANidine (ZANAFLEX) 4 MG tablet Take 0.5-1 tablets (2-4 mg total) by mouth every 6 (six) hours as needed for muscle spasms. 10/15/20  Yes Dameisha Tschida, [place] C, PA-C    Family History Family History  Problem Relation Age of Onset  . Cancer Mother   . Hepatitis C Mother   . Hypertension Mother     Social History Social History   Tobacco Use  . Smoking status: Light Tobacco Smoker  . Smokeless tobacco: Never Used  Substance Use Topics  . Alcohol use: Yes  . Drug use: No     Allergies   Patient has  no known allergies.   Review of Systems Review of Systems  Constitutional: Negative for fatigue and fever.  Eyes: Negative for redness, itching and visual disturbance.  Respiratory: Negative for shortness of breath.   Cardiovascular: Negative for chest pain and leg swelling.  Gastrointestinal: Negative for nausea and vomiting.  Musculoskeletal: Negative for arthralgias and myalgias.  Skin: Negative for color change, rash and wound.  Neurological: Positive for weakness and numbness. Negative for dizziness, syncope, light-headedness and headaches.     Physical Exam Triage Vital Signs ED Triage Vitals  Enc Vitals Group     BP      Pulse      Resp      Temp      Temp src      SpO2      Weight      Height      Head Circumference      Peak Flow      Pain Score      Pain Loc      Pain Edu?      Excl. in GC?    No data found.  Updated Vital Signs BP 130/83 (BP Location: Left Arm)   Pulse 94   Temp 98.9 F (37.2 C) (Oral)   Resp 18   SpO2 95%   Visual Acuity Right Eye Distance:   Left Eye Distance:  Bilateral Distance:    Right Eye Near:   Left Eye Near:    Bilateral Near:     Physical Exam Vitals and nursing note reviewed.  Constitutional:      Appearance: He is well-developed and well-nourished.     Comments: No acute distress  HENT:     Head: Normocephalic and atraumatic.     Ears:     Comments: Bilateral ears without tenderness to palpation of external auricle, tragus and mastoid, EAC's without erythema or swelling, TM's with good bony landmarks and cone of light. Non erythematous.     Nose: Nose normal.     Mouth/Throat:     Comments: Oral mucosa pink and moist, no tonsillar enlargement or exudate. Posterior pharynx patent and nonerythematous, no uvula deviation or swelling. Normal phonation. Eyes:     Conjunctiva/sclera: Conjunctivae normal.  Cardiovascular:     Rate and Rhythm: Normal rate.  Pulmonary:     Effort: Pulmonary effort is normal. No  respiratory distress.     Breath sounds: Normal breath sounds.  Abdominal:     General: There is no distension.  Musculoskeletal:        General: Normal range of motion.     Cervical back: Neck supple.  Skin:    General: Skin is warm and dry.  Neurological:     General: No focal deficit present.     Mental Status: He is alert and oriented to person, place, and time. Mental status is at baseline.     Comments: Patient A&O x3, cranial nerves II-XII grossly intact, strength at shoulders, hips and knees 5/5, equal bilaterally, patellar reflex 2+ bilaterally. Gait without abnormality.  Psychiatric:        Mood and Affect: Mood and affect normal.      UC Treatments / Results  Labs (all labs ordered are listed, but only abnormal results are displayed) Labs Reviewed - No data to display  EKG   Radiology No results found.  Procedures Procedures (including critical care time)  Medications Ordered in UC Medications - No data to display  Initial Impression / Assessment and Plan / UC Course  I have reviewed the triage vital signs and the nursing notes.  Pertinent labs & imaging results that were available during my care of the patient were reviewed by me and considered in my medical decision making (see chart for details).     Intermittent paresthesias with associated vision changes, fatigue, memory/speech problems, family history of MS, referring to neurology for further evaluation of possible MS contributing to symptoms.  No neuro deficit noted on exam today, advised patient if symptoms progressing, worsening to follow-up in the emergency room.  Provided hydroxyzine to use as needed for anxiety in the meantime, may supplement with muscle relaxers for muscle spasming/tightness.  Advised to limit use in combination with hydroxyzine to limit drowsiness from medicines.  Discussed strict return precautions. Patient verbalized understanding and is agreeable with plan.  Final Clinical  Impressions(s) / UC Diagnoses   Final diagnoses:  Paresthesias  Generalized weakness  Fatigue, unspecified type     Discharge Instructions     Please use Tylenol and ibuprofen as needed for muscle aches and pains May supplement tizanidine which is a muscle relaxer when at home at bedtime-may cause drowsiness May use hydroxyzine as needed for anxiety/difficulty sleeping-limit use with tizanidine as both may cause drowsiness  Referral placed for neurology If any symptoms worsening, developing increased weakness in extremities, vision changes, difficulty speaking please follow-up in the  emergency room for more emergent imaging    ED Prescriptions    Medication Sig Dispense Auth. Provider   hydrOXYzine (ATARAX/VISTARIL) 25 MG tablet Take 1 tablet (25 mg total) by mouth every 6 (six) hours as needed for anxiety (insomnia). 12 tablet Jema Deegan C, PA-C   tiZANidine (ZANAFLEX) 4 MG tablet Take 0.5-1 tablets (2-4 mg total) by mouth every 6 (six) hours as needed for muscle spasms. 30 tablet Nicki Furlan, [place] C, PA-C     PDMP not reviewed this encounter.   Lew Dawes, [place] 10/15/20 1502

## 2020-10-15 NOTE — Discharge Instructions (Signed)
Please use Tylenol and ibuprofen as needed for muscle aches and pains May supplement tizanidine which is a muscle relaxer when at home at bedtime-may cause drowsiness May use hydroxyzine as needed for anxiety/difficulty sleeping-limit use with tizanidine as both may cause drowsiness  Referral placed for neurology If any symptoms worsening, developing increased weakness in extremities, vision changes, difficulty speaking please follow-up in the emergency room for more emergent imaging

## 2020-10-15 NOTE — ED Triage Notes (Signed)
Pt states family hx of MS and worried he has it. Pt c/o numbness/tingling continuous to face, arms, hands, legs, and feet. Pt c/o fatigue for a very long time. States having problems speaking at times also. No distress noted.

## 2020-10-17 NOTE — Progress Notes (Signed)
NEUROLOGY CONSULTATION NOTE  Zachary Beard MRN: [id number] DOB: Jun 16, 1986  Referring provider: Patterson Hammersmith, PA-C (Urgent Care referral) Primary care provider: No PCP  Reason for consult:  Evaluate for multiple sclerosis  Assessment/Plan:   Multiple symptomatology including paresthesias, fatigue, cognitive changes with family history of MS.  Evaluation is warranted.  1.  We will first check MRI of brain/cervical/thoracic spine with and without contrast.  Further recommendations, such as LP, will be determined pending results. 2.  Will also check B12, TSH 3.  Follow up after testing.   Subjective:  Zachary Beard is a 35 year old right-handed male with no significant past medical history who presents for evaluation for multiple sclerosis.  History supplemented by Urgent Care note.  Beginning at age 70-18, he developed right facial numbness and tingling.  Since then, he has had intermittent numbness and tingling including worsening right facial numbness and involvement of all extremities but not the torso.  Symptoms have been daily for the past several years, worse since having COVID-19 (he had COVID 3 times, the last in early January).  No pain.  Sometimes his right leg may seem to give out a little but otherwise no weakness.  No bowel or bladder dysfunction.  He has blurred vision for which he wears glasses.  Eye doctors have identified astigmatism but it appears there was never a concern for optic neuritis.  He denies black out or gray out of vision.  Sometimes he feels woozy and off balance.  Sometimes he has foggy thinking, taking time to process things.  Symptoms are not worse during particular time of year such as summer.  No spinal pain when looking down.  He does have depression and anxiety.  He is concerned because his father may have had MS but certain grandparents and great grandparents on both sides had MS.  PAST MEDICAL HISTORY: Past Medical History:  Diagnosis Date   . Allergy     PAST SURGICAL HISTORY: Past Surgical History:  Procedure Laterality Date  . WISDOM TOOTH EXTRACTION      MEDICATIONS: Current Outpatient Medications on File Prior to Visit  Medication Sig Dispense Refill  . hydrOXYzine (ATARAX/VISTARIL) 25 MG tablet Take 1 tablet (25 mg total) by mouth every 6 (six) hours as needed for anxiety (insomnia). 12 tablet 0  . tiZANidine (ZANAFLEX) 4 MG tablet Take 0.5-1 tablets (2-4 mg total) by mouth every 6 (six) hours as needed for muscle spasms. 30 tablet 0   No current facility-administered medications on file prior to visit.    ALLERGIES: No Known Allergies  FAMILY HISTORY: Family History  Problem Relation Age of Onset  . Cancer Mother   . Hepatitis C Mother   . Hypertension Mother     Objective:  Blood pressure 129/74, pulse 81, resp. rate 18, height 6' (1.829 m), weight 242 lb (109.8 kg), SpO2 97 %. General: No acute distress.  Patient appears well-groomed.   Head:  Normocephalic/atraumatic Eyes:  fundi examined but not visualized Neck: supple, no paraspinal tenderness, full range of motion Back: No paraspinal tenderness Heart: regular rate and rhythm Lungs: Clear to auscultation bilaterally. Vascular: No carotid bruits. Neurological Exam: Mental status: alert and oriented to person, place, and time, recent and remote memory intact, fund of knowledge intact, attention and concentration intact, speech fluent and not dysarthric, language intact. Cranial nerves: CN I: not tested CN II: pupils equal, round and reactive to light, visual fields intact CN III, IV, VI:  full range of motion, no nystagmus,  no ptosis CN V: facial sensation reduced in right V1-V2 CN VII: upper and lower face symmetric CN VIII: hearing intact CN IX, X: gag intact, uvula midline CN XI: sternocleidomastoid and trapezius muscles intact CN XII: tongue midline Bulk & Tone: normal, no fasciculations. Motor:  muscle strength 5/5  throughout Sensation:  Pinprick sensation reduced in right upper extremity and bilateral feet, vibratory sensation intact. Deep Tendon Reflexes:  2+ throughout,  toes downgoing.   Finger to nose testing:  Without dysmetria.   Heel to shin:  Without dysmetria.   Gait:  Normal station and stride.  Able to turn and tandem walk.  Romberg negative.   Shon Millet, DO

## 2020-10-20 ENCOUNTER — Other Ambulatory Visit (INDEPENDENT_AMBULATORY_CARE_PROVIDER_SITE_OTHER): Payer: PRIVATE HEALTH INSURANCE

## 2020-10-20 ENCOUNTER — Encounter: Payer: Self-pay | Admitting: Neurology

## 2020-10-20 ENCOUNTER — Other Ambulatory Visit: Payer: Self-pay

## 2020-10-20 ENCOUNTER — Ambulatory Visit: Payer: PRIVATE HEALTH INSURANCE | Admitting: Neurology

## 2020-10-20 VITALS — BP 129/74 | HR 81 | Resp 18 | Ht 72.0 in | Wt 242.0 lb

## 2020-10-20 DIAGNOSIS — R202 Paresthesia of skin: Secondary | ICD-10-CM

## 2020-10-20 DIAGNOSIS — Z820 Family history of epilepsy and other diseases of the nervous system: Secondary | ICD-10-CM | POA: Diagnosis not present

## 2020-10-20 DIAGNOSIS — R29898 Other symptoms and signs involving the musculoskeletal system: Secondary | ICD-10-CM

## 2020-10-20 DIAGNOSIS — R200 Anesthesia of skin: Secondary | ICD-10-CM | POA: Diagnosis not present

## 2020-10-20 LAB — VITAMIN B12: Vitamin B-12: 477 pg/mL (ref 211–911)

## 2020-10-20 LAB — TSH: TSH: 1.36 u[IU]/mL (ref 0.35–4.50)

## 2020-10-20 NOTE — Patient Instructions (Signed)
1.  Check MRI of brain/cervical/thoracic spine with and without contrast 2.  Check B12, TSH 3.  Further recommendations pending results 4.  Follow up after testing.

## 2020-11-10 ENCOUNTER — Ambulatory Visit: Admission: RE | Admit: 2020-11-10 | Payer: PRIVATE HEALTH INSURANCE | Source: Ambulatory Visit

## 2020-11-10 ENCOUNTER — Ambulatory Visit
Admission: RE | Admit: 2020-11-10 | Discharge: 2020-11-10 | Disposition: A | Discharge status: Home / Self Care | Payer: PRIVATE HEALTH INSURANCE | Source: Ambulatory Visit | Attending: Neurology | Admitting: Neurology

## 2020-11-10 ENCOUNTER — Other Ambulatory Visit: Payer: Self-pay | Admitting: Neurology

## 2020-11-10 DIAGNOSIS — Z820 Family history of epilepsy and other diseases of the nervous system: Secondary | ICD-10-CM

## 2020-11-10 DIAGNOSIS — R29898 Other symptoms and signs involving the musculoskeletal system: Secondary | ICD-10-CM

## 2020-11-10 MED ORDER — GADOBENATE DIMEGLUMINE 529 MG/ML IV SOLN
20.0000 mL | Freq: Once | INTRAVENOUS | Status: AC | PRN
  Administered 2020-11-10: 20 mL via INTRAVENOUS

## 2020-11-12 ENCOUNTER — Ambulatory Visit
Admission: RE | Admit: 2020-11-12 | Discharge: 2020-11-12 | Disposition: A | Discharge status: Home / Self Care | Payer: PRIVATE HEALTH INSURANCE | Source: Ambulatory Visit | Attending: Neurology | Admitting: Neurology

## 2020-11-12 MED ORDER — GADOBENATE DIMEGLUMINE 529 MG/ML IV SOLN
20.0000 mL | Freq: Once | INTRAVENOUS | Status: AC | PRN
  Administered 2020-11-12: 20 mL via INTRAVENOUS

## 2020-11-13 ENCOUNTER — Telehealth: Payer: Self-pay

## 2020-11-13 NOTE — Telephone Encounter (Signed)
-----   Message from Van Clines, MD sent at 11/13/2020  9:10 AM EDT ----- Pls let him know the thoracic spine MRI looked fine, no significant abnormalities to cause his symptoms, thanks

## 2020-11-13 NOTE — Telephone Encounter (Signed)
Called patient and informed him of MRI results. Patient had no questions or concerns.

## 2020-11-17 ENCOUNTER — Encounter: Payer: PRIVATE HEALTH INSURANCE | Admitting: Family

## 2020-11-17 ENCOUNTER — Ambulatory Visit: Payer: No Typology Code available for payment source | Admitting: Family

## 2020-11-17 NOTE — Progress Notes (Deleted)
  Subjective:    Zachary Beard - 35 y.o. male MRN [id number]  Date of birth: 11/20/1985  HPI  Zachary Beard is to establish care. Patient has a PMH significant for ***.    Current issues and/or concerns:  ROS per HPI     Health Maintenance:  - *** Health Maintenance Due  Topic Date Due  . Hepatitis C Screening  Never done  . COVID-19 Vaccine (1) Never done  . HIV Screening  Never done  . INFLUENZA VACCINE  Never done     Past Medical History: There are no problems to display for this patient.     Social History   reports that he has been smoking. He has never used smokeless tobacco. He reports current alcohol use. He reports that he does not use drugs.   Family History  family history includes Cancer in his mother; Hepatitis C in his mother; Hypertension in his mother.   Medications: reviewed and updated   Objective:   Physical Exam There were no vitals taken for this visit. Physical Exam      Assessment & Plan:         Patient was given clear instructions to go to Emergency Department or return to medical center if symptoms don't improve, worsen, or new problems develop.The patient verbalized understanding.  I discussed the assessment and treatment plan with the patient. The patient was provided an opportunity to ask questions and all were answered. The patient agreed with the plan and demonstrated an understanding of the instructions.   The patient was advised to call back or seek an in-person evaluation if the symptoms worsen or if the condition fails to improve as anticipated.    Ricky Stabs, NP 11/17/2020, 8:41 AM Primary Care at [facility]

## 2020-11-17 NOTE — Progress Notes (Signed)
Patient did not show for appointment.   

## 2020-11-20 ENCOUNTER — Other Ambulatory Visit: Payer: PRIVATE HEALTH INSURANCE

## 2020-12-03 ENCOUNTER — Ambulatory Visit
Admission: RE | Admit: 2020-12-03 | Discharge: 2020-12-03 | Disposition: A | Discharge status: Home / Self Care | Payer: PRIVATE HEALTH INSURANCE | Source: Ambulatory Visit | Attending: Neurology | Admitting: Neurology

## 2020-12-03 ENCOUNTER — Other Ambulatory Visit: Payer: Self-pay

## 2020-12-03 DIAGNOSIS — R29898 Other symptoms and signs involving the musculoskeletal system: Secondary | ICD-10-CM

## 2020-12-03 DIAGNOSIS — Z820 Family history of epilepsy and other diseases of the nervous system: Secondary | ICD-10-CM

## 2020-12-03 MED ORDER — GADOBENATE DIMEGLUMINE 529 MG/ML IV SOLN
20.0000 mL | Freq: Once | INTRAVENOUS | Status: AC | PRN
  Administered 2020-12-03: 20 mL via INTRAVENOUS

## 2020-12-04 NOTE — Progress Notes (Signed)
Pt advised of his mri results. Pt wanted to know what he should do about he numbness and tingling?

## 2021-01-08 NOTE — Progress Notes (Signed)
Patient did not show for appointment.   

## 2021-01-09 ENCOUNTER — Other Ambulatory Visit: Payer: Self-pay

## 2021-01-09 ENCOUNTER — Encounter: Payer: PRIVATE HEALTH INSURANCE | Admitting: Family

## 2021-01-09 DIAGNOSIS — Z7689 Persons encountering health services in other specified circumstances: Secondary | ICD-10-CM
